# Patient Record
Sex: Male | Born: 1965 | Race: White | Hispanic: No | Marital: Married | Smoking: Never smoker
Health system: Southern US, Community
[De-identification: ages and names within clinical notes are randomized; demographics above are authoritative.]

## PROBLEM LIST (undated history)

## (undated) DIAGNOSIS — I10 Essential (primary) hypertension: Secondary | ICD-10-CM

---

## 2014-03-29 ENCOUNTER — Emergency Department (HOSPITAL_BASED_OUTPATIENT_CLINIC_OR_DEPARTMENT_OTHER): Payer: BC Managed Care – PPO

## 2014-03-29 ENCOUNTER — Emergency Department (HOSPITAL_BASED_OUTPATIENT_CLINIC_OR_DEPARTMENT_OTHER)
Admission: EM | Admit: 2014-03-29 | Discharge: 2014-03-29 | Discharge status: Against Medical Advice | Payer: BC Managed Care – PPO | Attending: Emergency Medicine | Admitting: Emergency Medicine

## 2014-03-29 ENCOUNTER — Encounter (HOSPITAL_BASED_OUTPATIENT_CLINIC_OR_DEPARTMENT_OTHER): Payer: Self-pay | Admitting: Emergency Medicine

## 2014-03-29 DIAGNOSIS — I10 Essential (primary) hypertension: Secondary | ICD-10-CM | POA: Insufficient documentation

## 2014-03-29 DIAGNOSIS — Y929 Unspecified place or not applicable: Secondary | ICD-10-CM | POA: Insufficient documentation

## 2014-03-29 DIAGNOSIS — S99929A Unspecified injury of unspecified foot, initial encounter: Principal | ICD-10-CM

## 2014-03-29 DIAGNOSIS — S8990XA Unspecified injury of unspecified lower leg, initial encounter: Secondary | ICD-10-CM | POA: Insufficient documentation

## 2014-03-29 DIAGNOSIS — S99919A Unspecified injury of unspecified ankle, initial encounter: Principal | ICD-10-CM

## 2014-03-29 DIAGNOSIS — Y9389 Activity, other specified: Secondary | ICD-10-CM | POA: Insufficient documentation

## 2014-03-29 DIAGNOSIS — W208XXA Other cause of strike by thrown, projected or falling object, initial encounter: Secondary | ICD-10-CM | POA: Insufficient documentation

## 2014-03-29 HISTORY — DX: Essential (primary) hypertension: I10

## 2014-03-29 NOTE — ED Notes (Signed)
Per registration pt left d/t long wait.

## 2014-03-29 NOTE — ED Notes (Signed)
Pt was moving a filing cabinet and it dropped onto the top of his right foot.

## 2016-01-31 IMAGING — CR DG FOOT COMPLETE 3+V*R*
3 series · 3 of 3 positions shown · non-contrast
Comparison: None.

CLINICAL DATA: Right foot pain following a crush injury today.

EXAM:
RIGHT FOOT COMPLETE - 3+ VIEW

[t foot ap right]
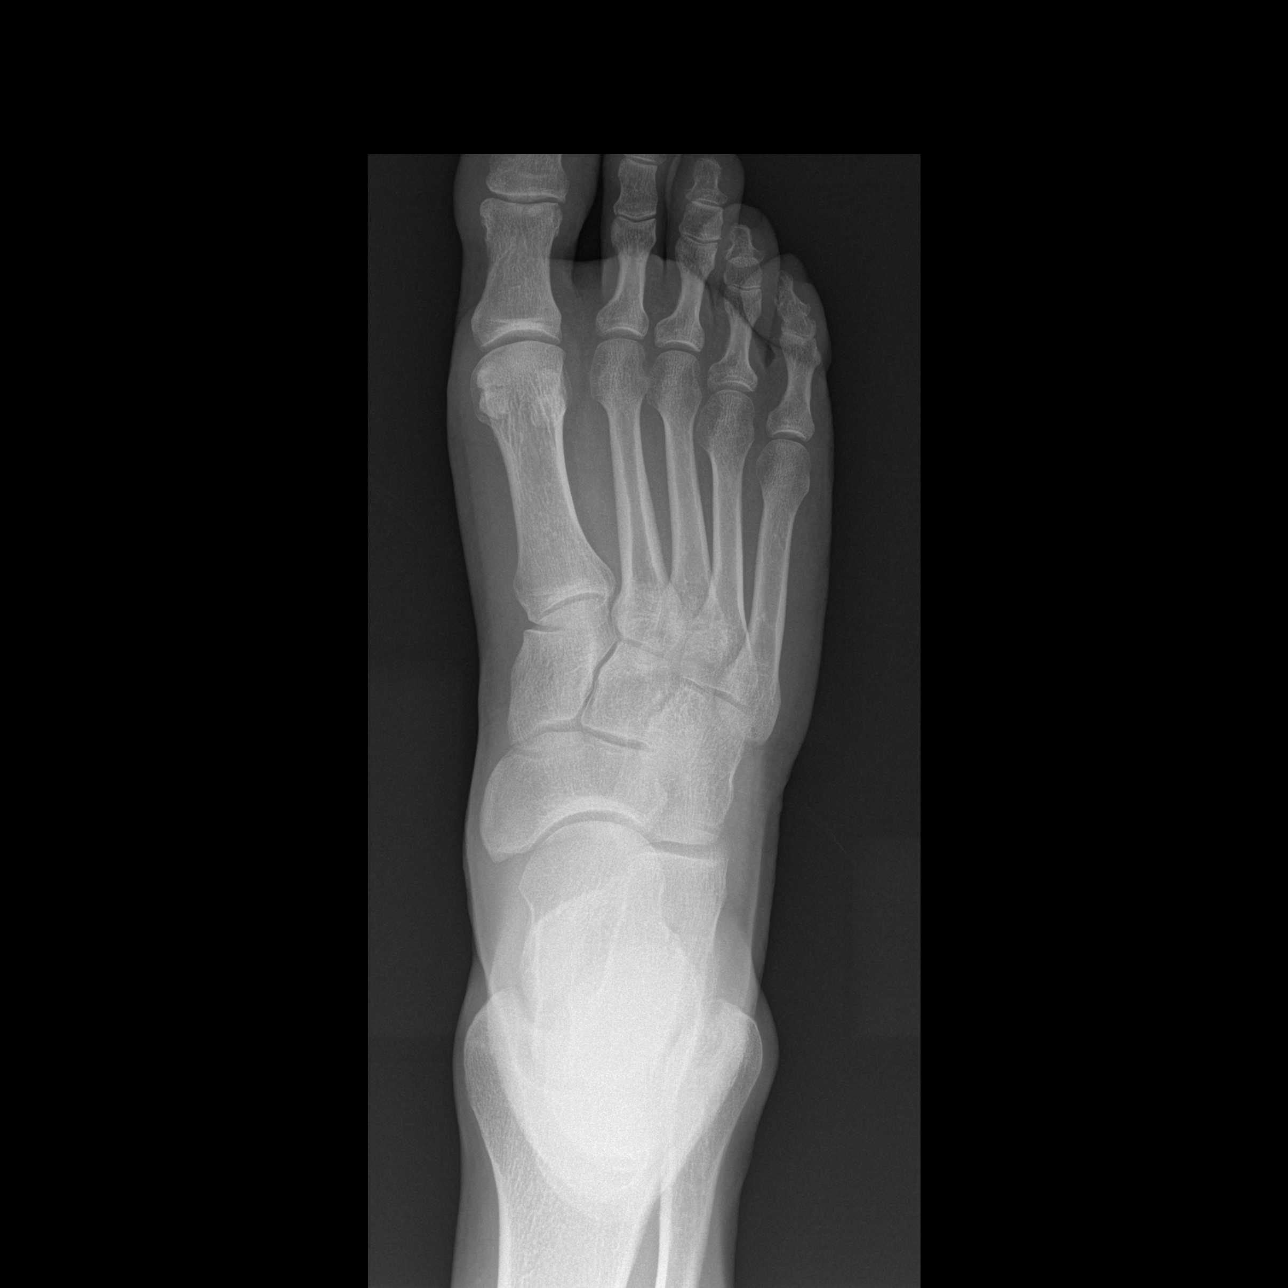

[t foot oblique right]
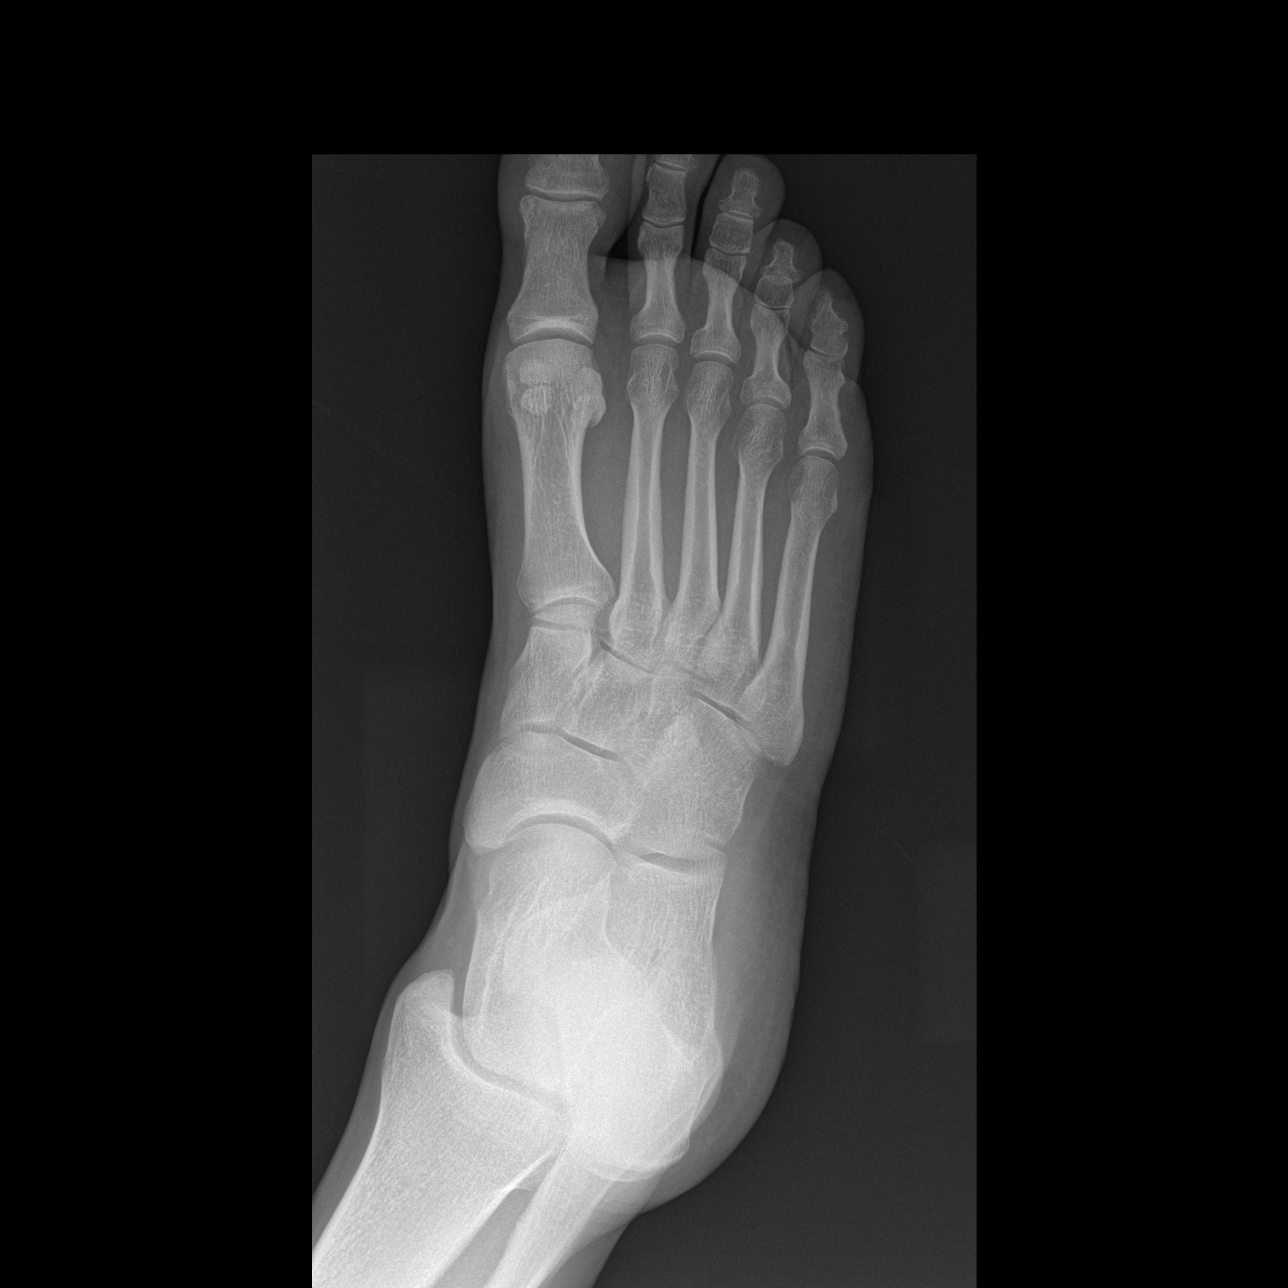

[t foot lat right]
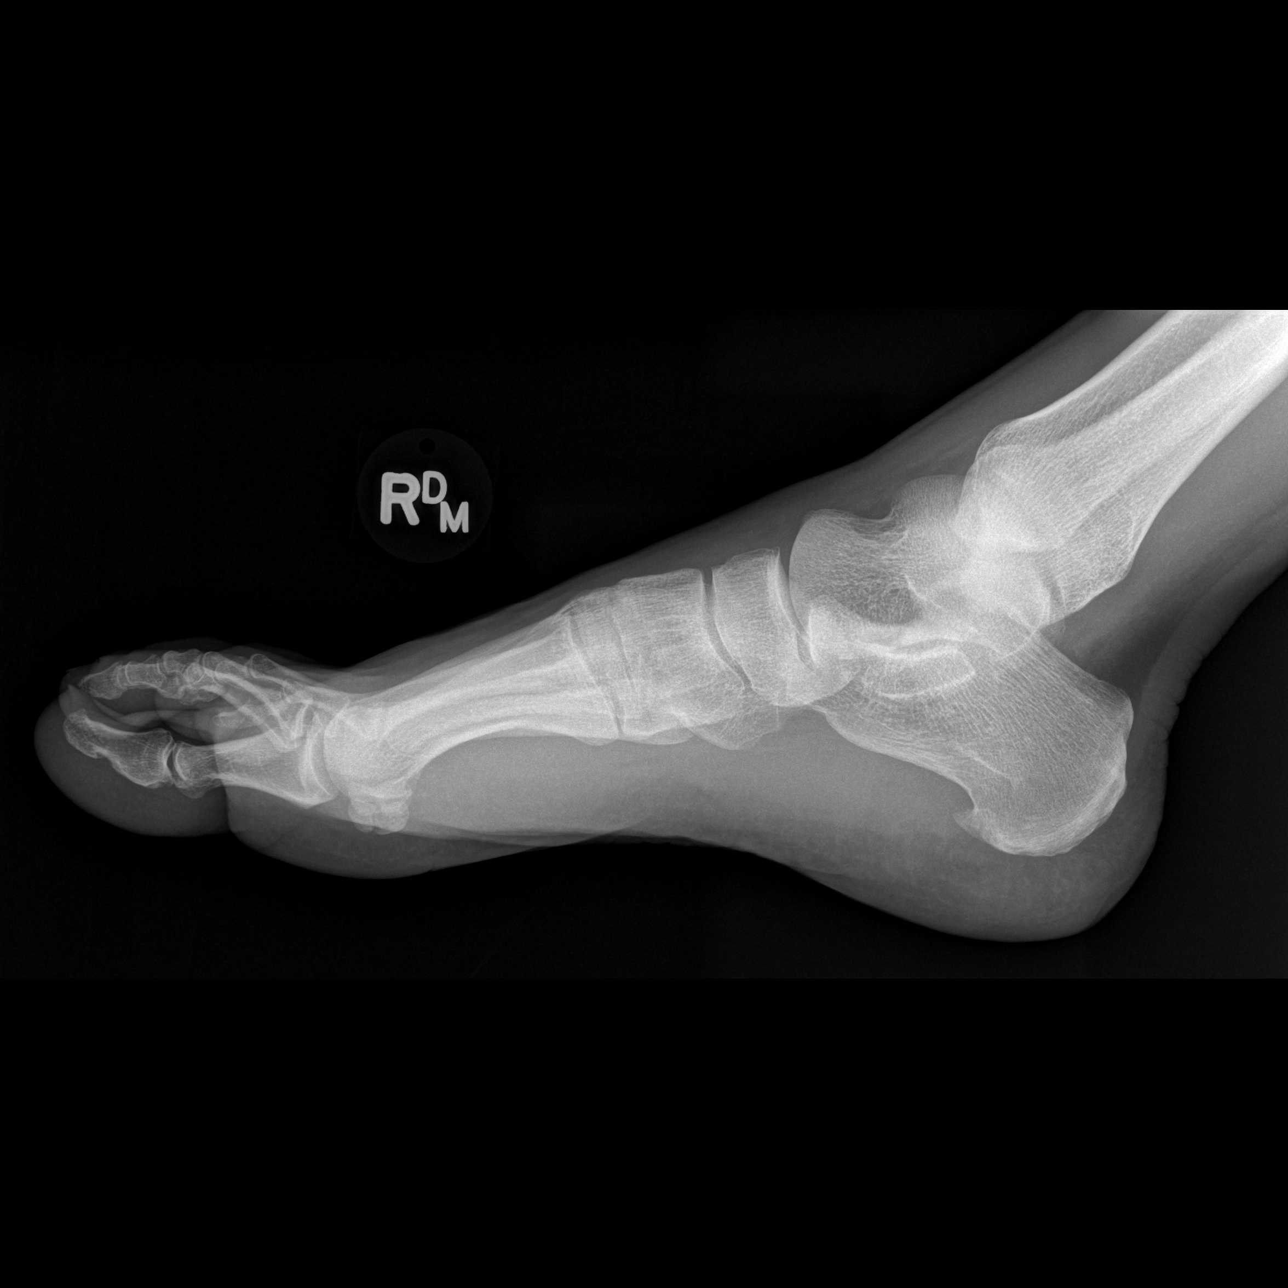

[3 of 3 positions shown; findings below may reference images not displayed]

FINDINGS: Moderate-sized inferior calcaneal spur. No fracture or dislocation
seen.
IMPRESSION: No fracture.  Calcaneal spur.
